# Patient Record
Sex: Male | Born: 1975 | Race: Black or African American | Hispanic: No | Marital: Single | Smoking: Current every day smoker
Health system: Southern US, Community
[De-identification: ages and names within clinical notes are randomized; demographics above are authoritative.]

---

## 2017-01-24 ENCOUNTER — Encounter: Payer: Self-pay | Admitting: Emergency Medicine

## 2017-01-24 ENCOUNTER — Other Ambulatory Visit: Payer: Self-pay

## 2017-01-24 ENCOUNTER — Emergency Department: Payer: No Typology Code available for payment source

## 2017-01-24 ENCOUNTER — Emergency Department
Admission: EM | Admit: 2017-01-24 | Discharge: 2017-01-24 | Disposition: A | Discharge status: Home / Self Care | Payer: No Typology Code available for payment source | Attending: Emergency Medicine | Admitting: Emergency Medicine

## 2017-01-24 DIAGNOSIS — S39012A Strain of muscle, fascia and tendon of lower back, initial encounter: Secondary | ICD-10-CM | POA: Diagnosis not present

## 2017-01-24 DIAGNOSIS — Y999 Unspecified external cause status: Secondary | ICD-10-CM | POA: Insufficient documentation

## 2017-01-24 DIAGNOSIS — M7918 Myalgia, other site: Secondary | ICD-10-CM

## 2017-01-24 DIAGNOSIS — F17200 Nicotine dependence, unspecified, uncomplicated: Secondary | ICD-10-CM | POA: Diagnosis not present

## 2017-01-24 DIAGNOSIS — M25562 Pain in left knee: Secondary | ICD-10-CM | POA: Diagnosis not present

## 2017-01-24 DIAGNOSIS — Y9389 Activity, other specified: Secondary | ICD-10-CM | POA: Insufficient documentation

## 2017-01-24 DIAGNOSIS — M25561 Pain in right knee: Secondary | ICD-10-CM | POA: Diagnosis not present

## 2017-01-24 DIAGNOSIS — Y92410 Unspecified street and highway as the place of occurrence of the external cause: Secondary | ICD-10-CM | POA: Insufficient documentation

## 2017-01-24 DIAGNOSIS — S3982XA Other specified injuries of lower back, initial encounter: Secondary | ICD-10-CM | POA: Diagnosis present

## 2017-01-24 MED ORDER — CYCLOBENZAPRINE HCL 10 MG PO TABS: 10.0000 mg | ORAL_TABLET | Freq: Three times a day (TID) | ORAL | 0 refills | Status: AC | PRN

## 2017-01-24 MED ORDER — NAPROXEN 500 MG PO TABS: 500.0000 mg | ORAL_TABLET | Freq: Two times a day (BID) | ORAL | Status: AC

## 2017-01-24 MED ORDER — NAPROXEN 500 MG PO TABS
500.0000 mg | ORAL_TABLET | Freq: Once | ORAL | Status: AC
  Administered 2017-01-24: 500 mg via ORAL
  Filled 2017-01-24: qty 1

## 2017-01-24 MED ORDER — CYCLOBENZAPRINE HCL 10 MG PO TABS
10.0000 mg | ORAL_TABLET | Freq: Once | ORAL | Status: AC
  Administered 2017-01-24: 10 mg via ORAL
  Filled 2017-01-24: qty 1
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
